# Patient Record
Sex: Female | Born: 1955 | Race: White | Hispanic: No | Marital: Married | Smoking: Never smoker
Health system: Southern US, Community
[De-identification: ages and names within clinical notes are randomized; demographics above are authoritative.]

## PROBLEM LIST (undated history)

## (undated) DIAGNOSIS — I341 Nonrheumatic mitral (valve) prolapse: Secondary | ICD-10-CM

## (undated) DIAGNOSIS — E039 Hypothyroidism, unspecified: Secondary | ICD-10-CM

## (undated) DIAGNOSIS — E079 Disorder of thyroid, unspecified: Secondary | ICD-10-CM

## (undated) HISTORY — PX: TONSILLECTOMY: SUR1361

## (undated) HISTORY — PX: ABDOMINAL HYSTERECTOMY: SHX81

## (undated) HISTORY — PX: CATARACT EXTRACTION: SUR2

## (undated) HISTORY — PX: BRAIN SURGERY: SHX531

## (undated) HISTORY — PX: NOSE SURGERY: SHX723

## (undated) HISTORY — DX: Nonrheumatic mitral (valve) prolapse: I34.1

## (undated) HISTORY — PX: REFRACTIVE SURGERY: SHX103

## (undated) HISTORY — DX: Hypothyroidism, unspecified: E03.9

---

## 1977-09-22 HISTORY — PX: BREAST EXCISIONAL BIOPSY: SUR124

## 2007-01-13 ENCOUNTER — Ambulatory Visit: Payer: Self-pay

## 2007-01-20 ENCOUNTER — Ambulatory Visit: Payer: Self-pay

## 2007-02-22 ENCOUNTER — Ambulatory Visit: Payer: Self-pay | Admitting: Oncology

## 2007-02-23 ENCOUNTER — Ambulatory Visit: Payer: Self-pay | Admitting: Oncology

## 2007-02-23 DIAGNOSIS — Z1379 Encounter for other screening for genetic and chromosomal anomalies: Secondary | ICD-10-CM

## 2007-02-23 HISTORY — DX: Encounter for other screening for genetic and chromosomal anomalies: Z13.79

## 2007-03-23 ENCOUNTER — Ambulatory Visit: Payer: Self-pay | Admitting: Oncology

## 2007-04-13 ENCOUNTER — Other Ambulatory Visit: Payer: Self-pay

## 2007-04-13 ENCOUNTER — Emergency Department: Payer: Self-pay | Admitting: Emergency Medicine

## 2007-04-22 ENCOUNTER — Ambulatory Visit: Payer: Self-pay | Admitting: Family Medicine

## 2007-04-24 ENCOUNTER — Emergency Department: Payer: Self-pay | Admitting: Emergency Medicine

## 2007-04-24 ENCOUNTER — Other Ambulatory Visit: Payer: Self-pay

## 2007-04-27 ENCOUNTER — Ambulatory Visit: Payer: Self-pay | Admitting: Gastroenterology

## 2007-05-20 ENCOUNTER — Ambulatory Visit: Payer: Self-pay | Admitting: Gastroenterology

## 2008-01-21 ENCOUNTER — Ambulatory Visit: Payer: Self-pay | Admitting: Unknown Physician Specialty

## 2008-01-27 ENCOUNTER — Ambulatory Visit: Payer: Self-pay | Admitting: Unknown Physician Specialty

## 2008-04-06 IMAGING — US ABDOMEN ULTRASOUND
1 series · 17 of 25 positions shown · non-contrast
Comparison: none

REASON FOR EXAM: RUQ Pain Positive Silindeni
COMMENTS:

[Series 1: abdomen ultrasound · 17 of 67 slices shown]
[im 1/67]
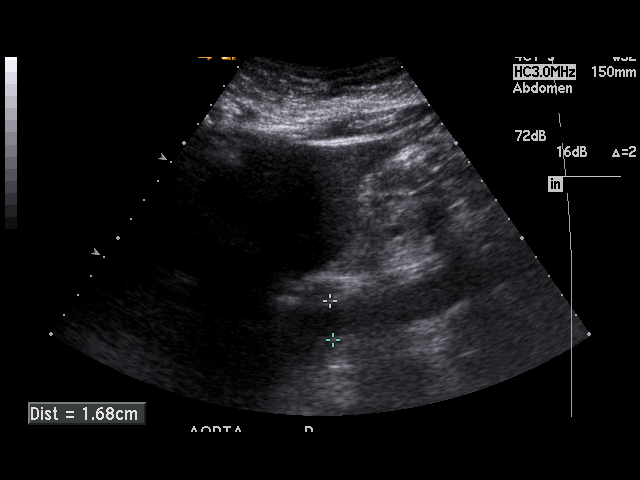
[im 6/67]
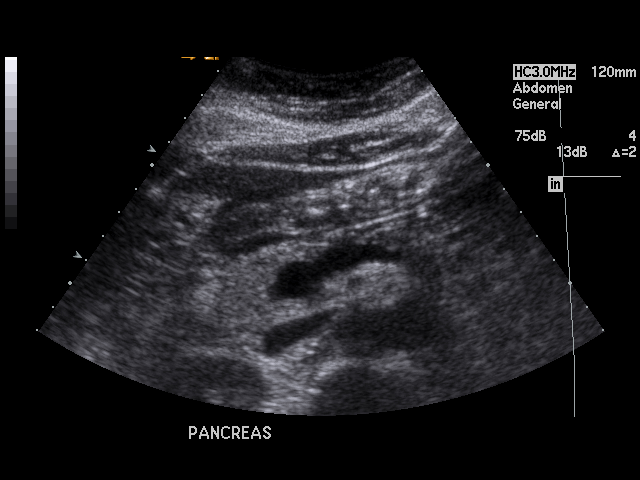
[im 9/67]
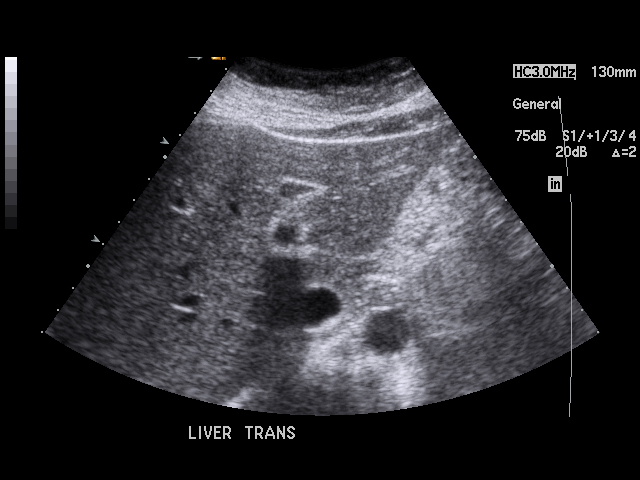
[im 14/67]
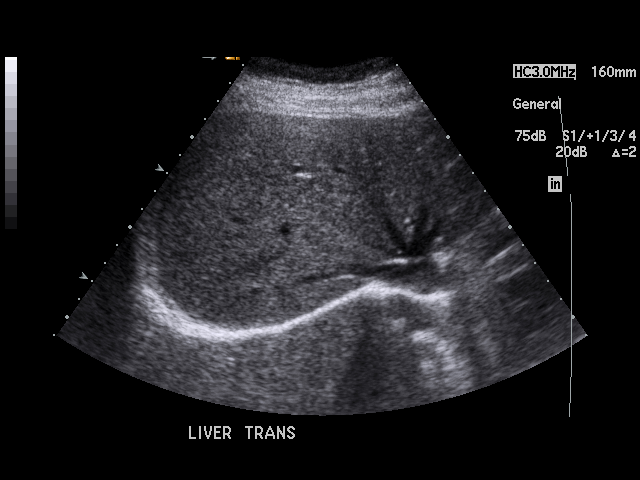
[im 17/67]
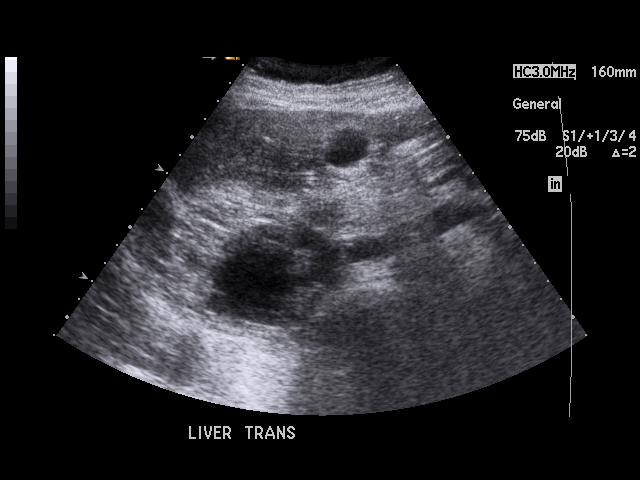
[im 23/67]
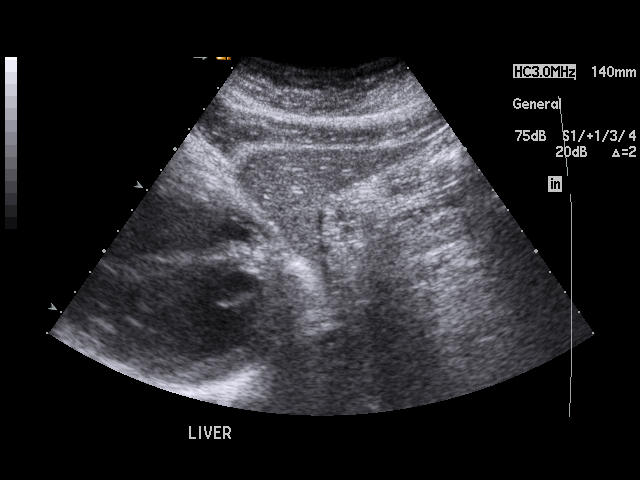
[im 25/67]
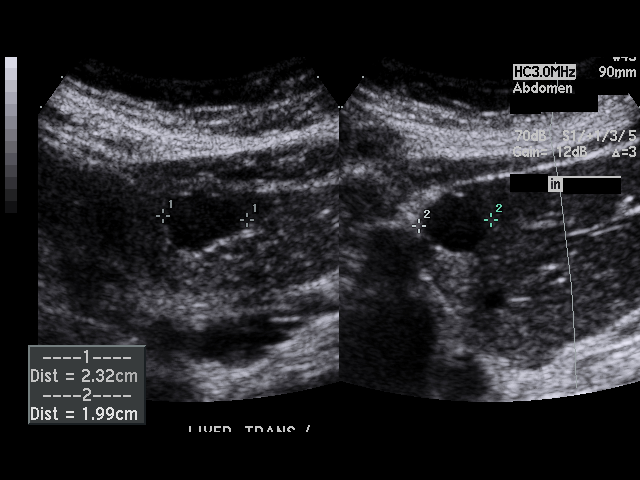
[im 31/67]
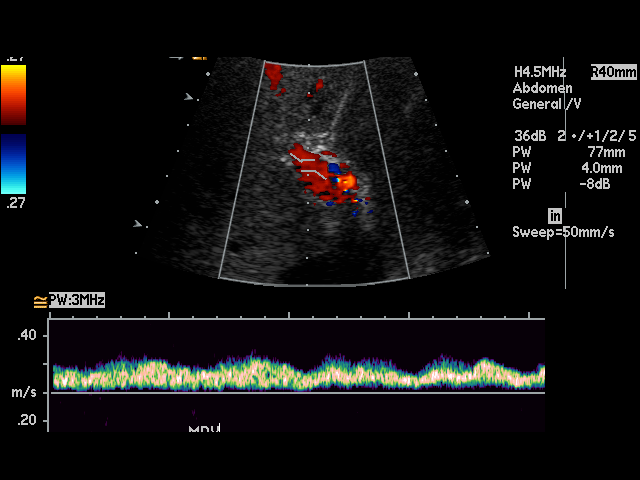
[im 34/67]
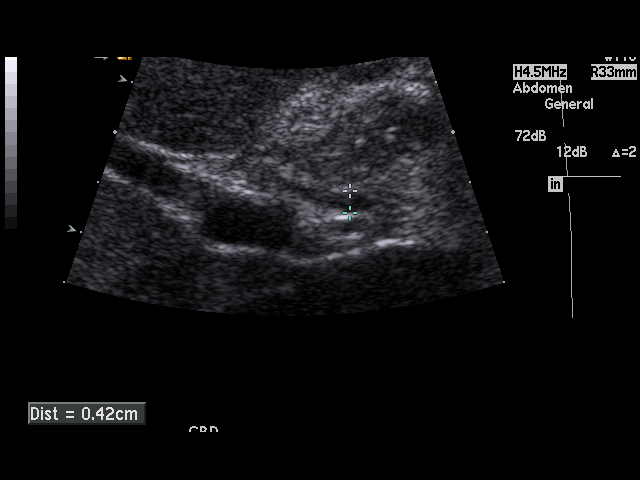
[im 36/67]
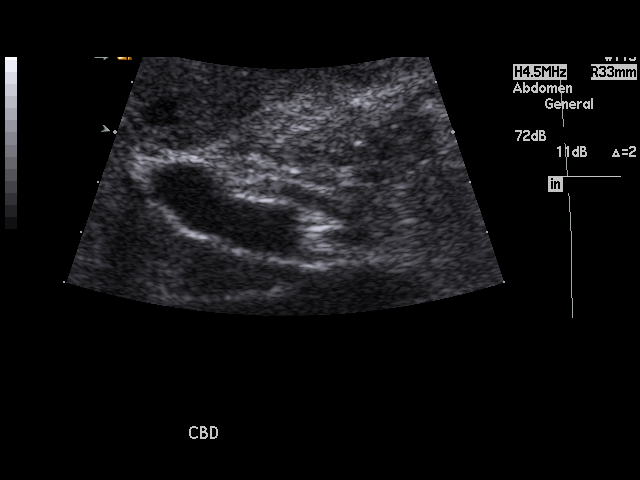
[im 42/67]
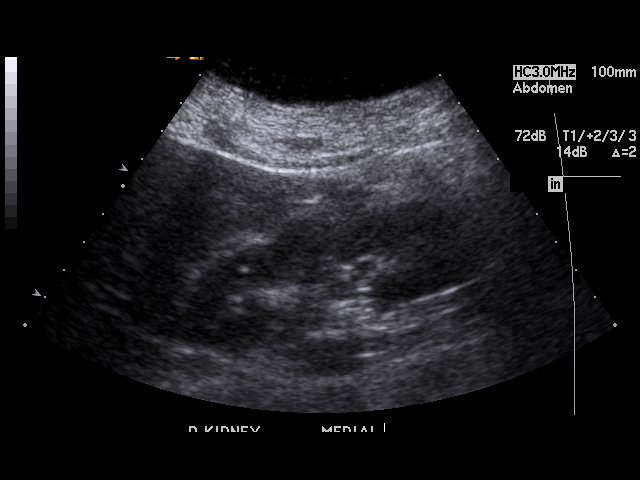
[im 45/67]
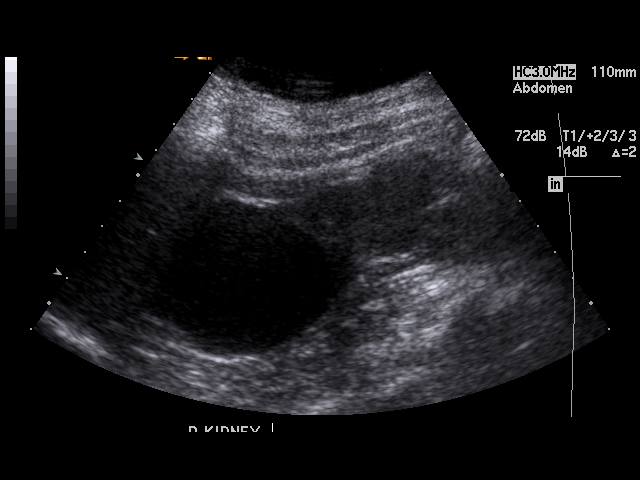
[im 50/67]
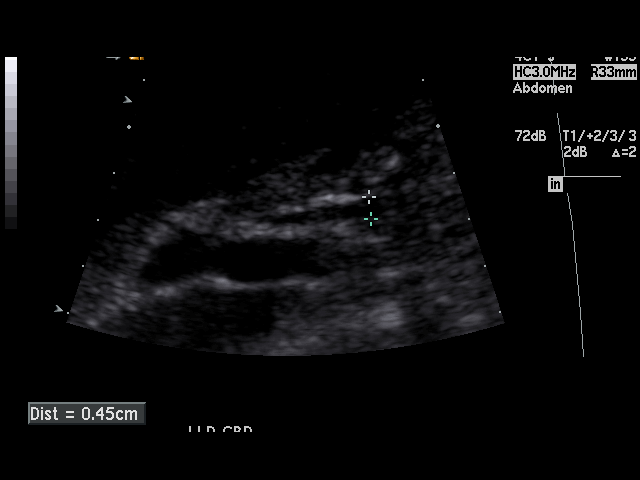
[im 53/67]
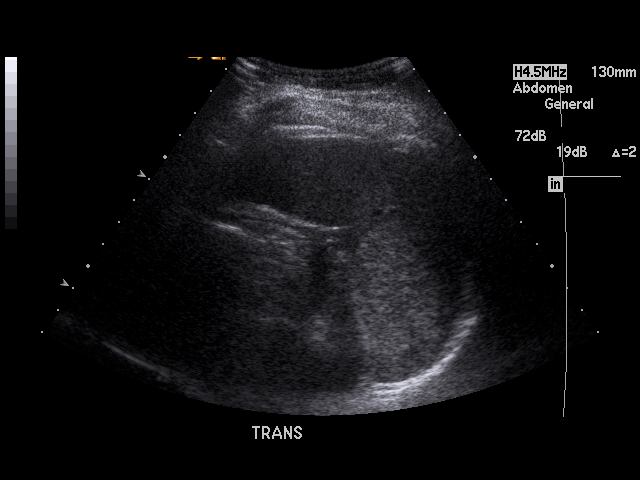
[im 58/67]
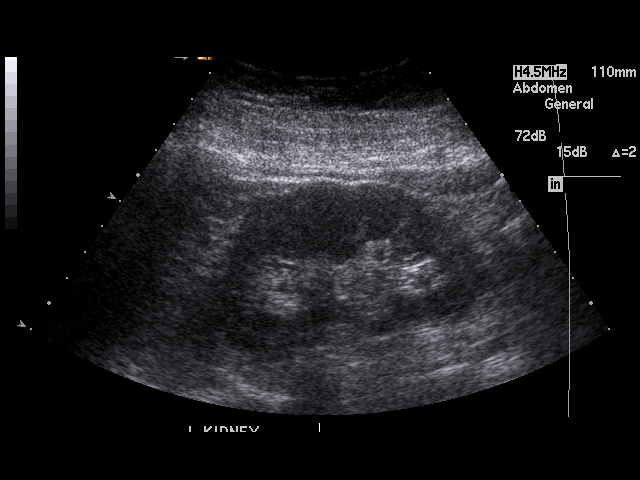
[im 61/67]
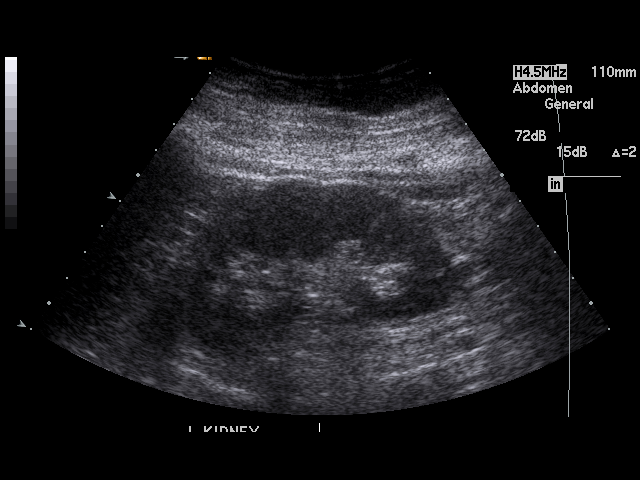
[im 67/67]
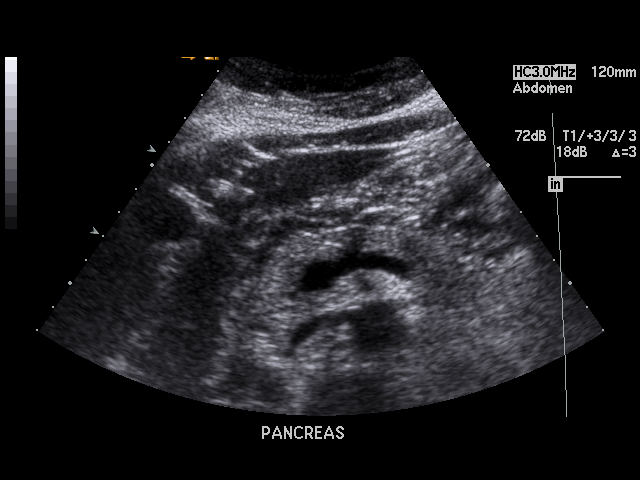

[17 of 25 positions shown; findings below may reference images not displayed]

PROCEDURE:     [facility] - [facility]DRICK ABDOMEN GENERAL SURVEY  - April 22, 2007  [DATE]

RESULT:      The liver exhibits normal echotexture. There is a cystic
structure present in the LEFT lobe which measures approximately 2.3 cm in
diameter. Portal venous flow is normal in direction toward the liver. The
pancreas, gallbladder, spleen, and abdominal aorta are normal in appearance.
The RIGHT kidney measures 12.3 cm in length. There is an approximately
cm diameter cyst associated with the upper pole of the RIGHT kidney. The
LEFT kidney measures 9.4 cm in length. There is no hydronephrosis. There is
no evidence of ascites.
IMPRESSION: 1. The gallbladder is normal in appearance with no evidence of stones.
2. There is a 5.0 cm diameter simple appearing upper pole cyst involving the
RIGHT kidney.
3. There is a cyst in the LEFT lobe of the liver measuring approximately
cm in diameter.

## 2008-11-23 ENCOUNTER — Ambulatory Visit: Payer: Self-pay | Admitting: Otolaryngology

## 2010-07-01 ENCOUNTER — Ambulatory Visit: Payer: Self-pay | Admitting: Family Medicine

## 2010-07-04 ENCOUNTER — Ambulatory Visit: Payer: Self-pay | Admitting: Family Medicine

## 2016-07-28 ENCOUNTER — Encounter (INDEPENDENT_AMBULATORY_CARE_PROVIDER_SITE_OTHER): Payer: PRIVATE HEALTH INSURANCE | Admitting: Ophthalmology

## 2016-07-28 DIAGNOSIS — H35371 Puckering of macula, right eye: Secondary | ICD-10-CM

## 2016-07-28 DIAGNOSIS — H353111 Nonexudative age-related macular degeneration, right eye, early dry stage: Secondary | ICD-10-CM | POA: Diagnosis not present

## 2016-07-28 DIAGNOSIS — H43813 Vitreous degeneration, bilateral: Secondary | ICD-10-CM

## 2016-10-28 ENCOUNTER — Encounter (INDEPENDENT_AMBULATORY_CARE_PROVIDER_SITE_OTHER): Payer: PRIVATE HEALTH INSURANCE | Admitting: Ophthalmology

## 2018-09-22 DIAGNOSIS — C50919 Malignant neoplasm of unspecified site of unspecified female breast: Secondary | ICD-10-CM

## 2018-09-22 HISTORY — DX: Malignant neoplasm of unspecified site of unspecified female breast: C50.919

## 2018-09-22 HISTORY — PX: MASTECTOMY, PARTIAL: SHX709

## 2018-12-18 ENCOUNTER — Other Ambulatory Visit: Payer: Self-pay

## 2018-12-18 ENCOUNTER — Ambulatory Visit
Admission: EM | Admit: 2018-12-18 | Discharge: 2018-12-18 | Disposition: A | Discharge status: Home / Self Care | Payer: Self-pay | Attending: Emergency Medicine | Admitting: Emergency Medicine

## 2018-12-18 ENCOUNTER — Encounter: Payer: Self-pay | Admitting: Gynecology

## 2018-12-18 DIAGNOSIS — R300 Dysuria: Secondary | ICD-10-CM

## 2018-12-18 HISTORY — DX: Disorder of thyroid, unspecified: E07.9

## 2018-12-18 LAB — WET PREP, GENITAL
Clue Cells Wet Prep HPF POC: NONE SEEN
Sperm: NONE SEEN
TRICH WET PREP: NONE SEEN
YEAST WET PREP: NONE SEEN

## 2018-12-18 LAB — URINALYSIS, COMPLETE (UACMP) WITH MICROSCOPIC
BILIRUBIN URINE: NEGATIVE
Bacteria, UA: NONE SEEN
GLUCOSE, UA: NEGATIVE mg/dL
HGB URINE DIPSTICK: NEGATIVE
KETONES UR: NEGATIVE mg/dL
LEUKOCYTE UA: NEGATIVE
NITRITE: NEGATIVE
PH: 5.5 (ref 5.0–8.0)
Protein, ur: NEGATIVE mg/dL
RBC / HPF: NONE SEEN RBC/hpf (ref 0–5)
Specific Gravity, Urine: 1.02 (ref 1.005–1.030)
WBC, UA: NONE SEEN WBC/hpf (ref 0–5)

## 2018-12-18 MED ORDER — TRIAMCINOLONE ACETONIDE 0.1 % EX CREA: 1.0000 "application " | TOPICAL_CREAM | Freq: Two times a day (BID) | CUTANEOUS | 0 refills | Status: DC

## 2018-12-18 MED ORDER — PHENAZOPYRIDINE HCL 200 MG PO TABS: 200.0000 mg | ORAL_TABLET | Freq: Three times a day (TID) | ORAL | 0 refills | Status: DC | PRN

## 2018-12-18 NOTE — Discharge Instructions (Signed)
I suspect that this is coming from the new toilet paper.  You can try the Pyridium and the triamcinolone which is a steroid that will help with the burning.  Use it sparingly and discontinue it as soon as you can

## 2018-12-18 NOTE — ED Triage Notes (Signed)
Per patient x yesterday burning with urination.

## 2018-12-18 NOTE — ED Provider Notes (Signed)
HPI  SUBJECTIVE:  Sara Sherman is a 63 y.o. female who presents with dysuria described as burning pain, urgency, frequency starting yesterday.  She states that she is urinating small amounts at a time.  No nausea, vomiting, fevers, body aches, abdominal, back, pelvic pain.  No cloudy or odorous urine, hematuria.  No vaginal itching, rash, odor, discharge, bleeding.  She has not had intercourse with her husband in 5 years.  No antipyretic in the past 4 to 6 hours.  No recent antibiotics, perfumed soaps or body washes.  She does not drink a lot of caffeine although she admits to drinking less water than usual this week.  She does note that they changed toilet paper recently-using a scented toilet paper. she tried Azo without improvement in her symptoms, symptoms are worse with urinating.  She has a past medical history of a UTI, yeast infections.  No history of pyelonephritis, nephrolithiasis, gonorrhea, chlamydia, HIV, HSV, syphilis, trichomonas, BV, diabetes, kidney disease.  PMD: Dr. Vickki Muff  Past Medical History:  Diagnosis Date  . Thyroid disease     Past Surgical History:  Procedure Laterality Date  . ABDOMINAL HYSTERECTOMY    . BRAIN SURGERY     rbreasr cyst remove  . NOSE SURGERY    . TONSILLECTOMY      Family History  Problem Relation Age of Onset  . Healthy Mother   . Breast cancer Mother   . COPD Father   . Aneurysm Father     Social History   Tobacco Use  . Smoking status: Never Smoker  . Smokeless tobacco: Never Used  Substance Use Topics  . Alcohol use: Yes    Frequency: Never  . Drug use: Never    No current facility-administered medications for this encounter.   Current Outpatient Medications:  .  cetirizine (ZYRTEC) 10 MG tablet, Take by mouth., Disp: , Rfl:  .  levothyroxine (SYNTHROID, LEVOTHROID) 112 MCG tablet, Take 1 tablet by mouth once daily, Disp: , Rfl:  .  phenazopyridine (PYRIDIUM) 200 MG tablet, Take 1 tablet (200 mg total) by mouth 3 (three)  times daily as needed for pain., Disp: 6 tablet, Rfl: 0 .  triamcinolone cream (KENALOG) 0.1 %, Apply 1 application topically 2 (two) times daily. As needed.  Discontinue as soon as possible., Disp: 30 g, Rfl: 0  No Known Allergies   ROS  As noted in HPI.   Physical Exam  BP 134/74 (BP Location: Left Arm)   Pulse 73   Temp 98.2 F (36.8 C) (Oral)   Resp 16   Ht 5\' 1"  (1.549 m)   Wt 72.6 kg   SpO2 100%   Breastfeeding Unknown   BMI 30.23 kg/m   Constitutional: Well developed, well nourished, no acute distress Eyes:  EOMI, conjunctiva normal bilaterally HENT: Normocephalic, atraumatic,mucus membranes moist Respiratory: Normal inspiratory effort Cardiovascular: Normal rate GI: nondistended. Flat, soft, active bowel sounds. No suprapubic, flank tenderness Back: no CVAT skin: No rash, skin intact Musculoskeletal: no deformities Neurologic: Alert & oriented x 3, no focal neuro deficits Psychiatric: Speech and behavior appropriate   ED Course   Medications - No data to display  Orders Placed This Encounter  Procedures  . Wet prep, genital    Standing Status:   Standing    Number of Occurrences:   1  . Urine culture    Standing Status:   Standing    Number of Occurrences:   1    Order Specific Question:   Patient  immune status    Answer:   Normal  . Urinalysis, Complete w Microscopic    Standing Status:   Standing    Number of Occurrences:   1    Results for orders placed or performed during the hospital encounter of 12/18/18 (from the past 24 hour(s))  Urinalysis, Complete w Microscopic     Status: None   Collection Time: 12/18/18 10:34 AM  Result Value Ref Range   Color, Urine YELLOW YELLOW   APPearance CLEAR CLEAR   Specific Gravity, Urine 1.020 1.005 - 1.030   pH 5.5 5.0 - 8.0   Glucose, UA NEGATIVE NEGATIVE mg/dL   Hgb urine dipstick NEGATIVE NEGATIVE   Bilirubin Urine NEGATIVE NEGATIVE   Ketones, ur NEGATIVE NEGATIVE mg/dL   Protein, ur NEGATIVE  NEGATIVE mg/dL   Nitrite NEGATIVE NEGATIVE   Leukocytes,Ua NEGATIVE NEGATIVE   Squamous Epithelial / LPF 0-5 0 - 5   WBC, UA NONE SEEN 0 - 5 WBC/hpf   RBC / HPF NONE SEEN 0 - 5 RBC/hpf   Bacteria, UA NONE SEEN NONE SEEN  Wet prep, genital     Status: Abnormal   Collection Time: 12/18/18 11:06 AM  Result Value Ref Range   Yeast Wet Prep HPF POC NONE SEEN NONE SEEN   Trich, Wet Prep NONE SEEN NONE SEEN   Clue Cells Wet Prep HPF POC NONE SEEN NONE SEEN   WBC, Wet Prep HPF POC RARE (A) NONE SEEN   Sperm NONE SEEN    No results found.  ED Clinical Impression  Dysuria   ED Assessment/Plan  Urine negative for UTI.  No bacteria in it.  Will check a wet prep.  We will send urine for culture to confirm absence of UTI.  Wet prep negative for yeast, BV, trichomonas.  Suspect contact irritant dermatitis from the lavender scented toilet paper which is new.  Will send home with Pyridium, triamcinolone cream.  discontinue the toilet paper.  Follow-up with PMD in several days.  Discussed labs, MDM, treatment plan, and plan for follow-up with patient. Discussed sn/sx that should prompt return to the ED. patient agrees with plan.   Meds ordered this encounter  Medications  . triamcinolone cream (KENALOG) 0.1 %    Sig: Apply 1 application topically 2 (two) times daily. As needed.  Discontinue as soon as possible.    Dispense:  30 g    Refill:  0  . phenazopyridine (PYRIDIUM) 200 MG tablet    Sig: Take 1 tablet (200 mg total) by mouth 3 (three) times daily as needed for pain.    Dispense:  6 tablet    Refill:  0    *This clinic note was created using [employer]. Therefore, there may be occasional mistakes despite careful proofreading.   ?    Melynda Ripple, MD 12/18/18 1731

## 2018-12-20 LAB — URINE CULTURE
Culture: 30000 — AB
Special Requests: NORMAL

## 2019-08-10 ENCOUNTER — Other Ambulatory Visit: Payer: Self-pay

## 2019-08-10 ENCOUNTER — Encounter: Payer: Self-pay | Admitting: Radiation Oncology

## 2019-08-10 ENCOUNTER — Ambulatory Visit
Admission: RE | Admit: 2019-08-10 | Discharge: 2019-08-10 | Disposition: A | Discharge status: Home / Self Care | Payer: Self-pay | Source: Ambulatory Visit | Attending: Radiation Oncology | Admitting: Radiation Oncology

## 2019-08-10 VITALS — BP 126/73 | HR 75 | Temp 97.7°F | Resp 16 | Wt 163.2 lb

## 2019-08-10 DIAGNOSIS — E079 Disorder of thyroid, unspecified: Secondary | ICD-10-CM | POA: Insufficient documentation

## 2019-08-10 DIAGNOSIS — D0511 Intraductal carcinoma in situ of right breast: Secondary | ICD-10-CM | POA: Insufficient documentation

## 2019-08-10 DIAGNOSIS — Z791 Long term (current) use of non-steroidal anti-inflammatories (NSAID): Secondary | ICD-10-CM | POA: Insufficient documentation

## 2019-08-10 DIAGNOSIS — Z170 Estrogen receptor positive status [ER+]: Secondary | ICD-10-CM | POA: Insufficient documentation

## 2019-08-10 NOTE — Consult Note (Signed)
NEW PATIENT EVALUATION  Name: Sara Sherman  MRN: [id number]  Date:   08/10/2019     DOB: [birth year]   This 63 y.o. 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 provider found  CHIEF COMPLAINT:  Chief Complaint  Patient presents with  . Breast Cancer    Initial consultation    DIAGNOSIS: The encounter diagnosis was Ductal carcinoma in situ (DCIS) of right breast.   PREVIOUS INVESTIGATIONS:  Mammogram and ultrasound reviewed Pathology report reviewed none Clinical notes reviewed  HPI: Patient is a 63 year old female who presented back in September r with abnormal mammogram performed at [facility].  At that time there was a mass at the 9 o'clock position of the right breast with circumscribed margins measuring 5 x 2 x 2 mm.  There was also a area of 7 mm amorphous calcifications in a grouped distribution seen in the right breast at the 10 o'clock position 4 cm posterior spiculated mass at mid depth.  The irregular spiculated margins were seen in the upper outer quadrant at 11:00 in mid depth.  Patient on October 29 had a wide local excision showing ductal carcinoma in situ with a small area of microinvasion ER/PR HER-2/neu status was insufficient based on tissue size.  The microinvasive component was 0.5 mm.  There was high-grade ductal carcinoma in situ with central comedonecrosis.  Margins of resection were negative for the in situ component by 0.5 mm.  Invasive carcinoma margins were negative at greater than 3 mm.  She is done well post lumpectomy.  She was seen by Dr. Tollie Pizza for second opinion and recommendation was made for adjuvant radiation therapy.  Dr. Tollie Pizza is decided based on the small microinvasive component not to perform sentinel node biopsy which I agree with.  She has had ultrasound showing a seroma cavity for possible  MammoSite balloon placement.  She is otherwise doing well specifically denies breast tenderness cough or bone pain.  PLANNED TREATMENT REGIMEN: Right breast accelerated partial breast radiation  PAST MEDICAL HISTORY:  has a past medical history of Thyroid disease.    PAST SURGICAL HISTORY:  Past Surgical History:  Procedure Laterality Date  . ABDOMINAL HYSTERECTOMY    . BRAIN SURGERY     rbreasr cyst remove  . NOSE SURGERY    . TONSILLECTOMY      FAMILY HISTORY: family history includes Aneurysm in her father; Breast cancer in her mother; COPD in her father; Healthy in her mother.  SOCIAL HISTORY:  reports that she has never smoked. She has never used smokeless tobacco. She reports current alcohol use. She reports that she does not use drugs.  ALLERGIES: Patient has no known allergies.  MEDICATIONS:  Current Outpatient Medications  Medication Sig Dispense Refill  . ketorolac (ACULAR) 0.5 % ophthalmic solution Place 1 drop into the right eye 4 (four) times daily.    Marland Kitchen levothyroxine (SYNTHROID, LEVOTHROID) 112 MCG tablet Take 1 tablet by mouth once daily    . loratadine (CLARITIN) 10 MG tablet Take 10 mg by mouth daily.     No current facility-administered medications for this encounter.     ECOG PERFORMANCE STATUS:  0 - Asymptomatic  REVIEW OF SYSTEMS: Patient denies any weight loss, fatigue, weakness, fever, chills or night sweats. Patient denies any loss of vision, blurred vision. Patient denies any ringing  of the ears or hearing  loss. No irregular heartbeat. Patient denies heart murmur or history of fainting. Patient denies any chest pain or pain radiating to her upper extremities. Patient denies any shortness of breath, difficulty breathing at night, cough or hemoptysis. Patient denies any swelling in the lower legs. Patient denies any nausea vomiting, vomiting of blood, or coffee ground material in the vomitus. Patient denies any stomach pain. Patient states has had normal  bowel movements no significant constipation or diarrhea. Patient denies any dysuria, hematuria or significant nocturia. Patient denies any problems walking, swelling in the joints or loss of balance. Patient denies any skin changes, loss of hair or loss of weight. Patient denies any excessive worrying or anxiety or significant depression. Patient denies any problems with insomnia. Patient denies excessive thirst, polyuria, polydipsia. Patient denies any swollen glands, patient denies easy bruising or easy bleeding. Patient denies any recent infections, allergies or URI. Patient "s visual fields have not changed significantly in recent time.   PHYSICAL EXAM: BP 126/73 (BP Location: Left Arm, Patient Position: Sitting)   Pulse 75   Temp 97.7 F (36.5 C) (Tympanic)   Resp 16   Wt 163 lb 3.2 oz (74 kg)   BMI 30.84 kg/m  Right breast as well healed lumpectomy incision.  No dominant mass or nodularity is noted in either breast in 2 positions examined.  No axillary or supraclavicular adenopathy is appreciated.  Well-developed well-nourished patient in NAD. HEENT reveals PERLA, EOMI, discs not visualized.  Oral cavity is clear. No oral mucosal lesions are identified. Neck is clear without evidence of cervical or supraclavicular adenopathy. Lungs are clear to A&P. Cardiac examination is essentially unremarkable with regular rate and rhythm without murmur rub or thrill. Abdomen is benign with no organomegaly or masses noted. Motor sensory and DTR levels are equal and symmetric in the upper and lower extremities. Cranial nerves II through XII are grossly intact. Proprioception is intact. No peripheral adenopathy or edema is identified. No motor or sensory levels are noted. Crude visual fields are within normal range.  LABORATORY DATA: Pathology report reviewed    RADIOLOGY RESULTS: Mammogram and ultrasound reviewed   IMPRESSION: Stage I invasive mammary carcinoma with based on microinvasion of predominantly  high-grade ductal carcinoma in situ status post wide local excision of the right breast and 63 year old female ER/PR status not able to be determined by scant amount of pathology.  PLAN: At this time I believe patient will be a good candidate for accelerated partial breast irradiation.  Risks and benefits of treatment including possible permanent seroma cavity possible small area of skin reaction fatigue thickness of the lumpectomy site all were discussed in detail with the patient.  We will arrange with Dr. Barbette Hair office for balloon catheter placement followed by CT simulation and radiation therapy 3400 cGy in 10 fractions at 340 cGy twice daily.  Patient comprehends my treatment plan well.  Patient also will be seen by medical oncology in the future for determination of possible antiestrogen therapy.  I would like to take this opportunity to thank you for allowing me to participate in the care of your patient.Noreene Filbert, MD

## 2019-08-24 ENCOUNTER — Other Ambulatory Visit: Payer: Self-pay

## 2019-08-24 ENCOUNTER — Ambulatory Visit
Admission: RE | Admit: 2019-08-24 | Discharge: 2019-08-24 | Disposition: A | Discharge status: Home / Self Care | Payer: Self-pay | Source: Ambulatory Visit | Attending: Radiation Oncology | Admitting: Radiation Oncology

## 2019-08-24 DIAGNOSIS — D0511 Intraductal carcinoma in situ of right breast: Secondary | ICD-10-CM | POA: Insufficient documentation

## 2019-08-25 ENCOUNTER — Ambulatory Visit
Admission: RE | Admit: 2019-08-25 | Discharge: 2019-08-25 | Disposition: A | Discharge status: Home / Self Care | Payer: Self-pay | Source: Ambulatory Visit | Attending: Radiation Oncology | Admitting: Radiation Oncology

## 2019-08-25 ENCOUNTER — Other Ambulatory Visit: Payer: Self-pay

## 2019-08-26 ENCOUNTER — Ambulatory Visit
Admission: RE | Admit: 2019-08-26 | Discharge: 2019-08-26 | Disposition: A | Discharge status: Home / Self Care | Payer: Self-pay | Source: Ambulatory Visit | Attending: Radiation Oncology | Admitting: Radiation Oncology

## 2019-08-26 ENCOUNTER — Other Ambulatory Visit: Payer: Self-pay

## 2019-08-29 ENCOUNTER — Ambulatory Visit
Admission: RE | Admit: 2019-08-29 | Discharge: 2019-08-29 | Disposition: A | Discharge status: Home / Self Care | Payer: Self-pay | Source: Ambulatory Visit | Attending: Radiation Oncology | Admitting: Radiation Oncology

## 2019-08-29 ENCOUNTER — Other Ambulatory Visit: Payer: Self-pay

## 2019-08-30 ENCOUNTER — Ambulatory Visit
Admission: RE | Admit: 2019-08-30 | Discharge: 2019-08-30 | Disposition: A | Discharge status: Home / Self Care | Payer: Self-pay | Source: Ambulatory Visit | Attending: Radiation Oncology | Admitting: Radiation Oncology

## 2019-08-30 ENCOUNTER — Other Ambulatory Visit: Payer: Self-pay

## 2019-08-31 ENCOUNTER — Ambulatory Visit
Admission: RE | Admit: 2019-08-31 | Discharge: 2019-08-31 | Disposition: A | Discharge status: Home / Self Care | Payer: Self-pay | Source: Ambulatory Visit | Attending: Radiation Oncology | Admitting: Radiation Oncology

## 2019-08-31 ENCOUNTER — Other Ambulatory Visit: Payer: Self-pay

## 2019-09-01 ENCOUNTER — Ambulatory Visit: Payer: Self-pay

## 2019-09-02 ENCOUNTER — Ambulatory Visit: Payer: Self-pay

## 2019-09-05 ENCOUNTER — Ambulatory Visit: Payer: Self-pay

## 2019-09-06 ENCOUNTER — Ambulatory Visit: Payer: Self-pay

## 2019-09-07 ENCOUNTER — Other Ambulatory Visit: Payer: Self-pay | Admitting: General Surgery

## 2019-09-07 ENCOUNTER — Ambulatory Visit: Payer: Self-pay

## 2019-09-07 DIAGNOSIS — Z79811 Long term (current) use of aromatase inhibitors: Secondary | ICD-10-CM

## 2019-09-07 NOTE — Progress Notes (Signed)
[id number]

## 2019-09-08 ENCOUNTER — Ambulatory Visit: Payer: Self-pay

## 2019-09-09 ENCOUNTER — Ambulatory Visit: Payer: Self-pay

## 2019-09-12 ENCOUNTER — Ambulatory Visit: Payer: Self-pay

## 2019-09-13 ENCOUNTER — Ambulatory Visit: Payer: Self-pay

## 2019-09-14 ENCOUNTER — Ambulatory Visit: Payer: Self-pay

## 2019-09-15 ENCOUNTER — Ambulatory Visit: Payer: Self-pay

## 2019-09-19 ENCOUNTER — Ambulatory Visit: Payer: Self-pay

## 2019-09-20 ENCOUNTER — Ambulatory Visit: Payer: Self-pay

## 2019-09-21 ENCOUNTER — Ambulatory Visit: Payer: Self-pay

## 2019-09-22 ENCOUNTER — Ambulatory Visit: Payer: Self-pay

## 2019-09-26 ENCOUNTER — Ambulatory Visit: Payer: Self-pay

## 2019-09-27 ENCOUNTER — Ambulatory Visit: Payer: Self-pay

## 2019-09-28 ENCOUNTER — Ambulatory Visit: Payer: Self-pay

## 2019-09-29 ENCOUNTER — Ambulatory Visit: Payer: Self-pay

## 2019-09-30 ENCOUNTER — Ambulatory Visit: Payer: Self-pay

## 2019-10-03 ENCOUNTER — Ambulatory Visit: Payer: Self-pay

## 2019-10-04 ENCOUNTER — Ambulatory Visit: Payer: Self-pay

## 2019-10-05 ENCOUNTER — Ambulatory Visit: Payer: Self-pay

## 2019-10-06 ENCOUNTER — Ambulatory Visit: Payer: Self-pay

## 2019-10-07 ENCOUNTER — Ambulatory Visit: Payer: Self-pay

## 2019-10-10 ENCOUNTER — Other Ambulatory Visit: Payer: Self-pay

## 2019-10-12 ENCOUNTER — Ambulatory Visit: Payer: Self-pay | Admitting: Radiation Oncology

## 2020-01-14 ENCOUNTER — Other Ambulatory Visit: Payer: Self-pay

## 2020-01-14 ENCOUNTER — Ambulatory Visit: Payer: Self-pay

## 2020-01-14 ENCOUNTER — Ambulatory Visit
Admission: EM | Admit: 2020-01-14 | Discharge: 2020-01-14 | Disposition: A | Discharge status: Home / Self Care | Payer: Self-pay | Attending: Family Medicine | Admitting: Family Medicine

## 2020-01-14 DIAGNOSIS — S93491A Sprain of other ligament of right ankle, initial encounter: Secondary | ICD-10-CM

## 2020-01-14 NOTE — Discharge Instructions (Signed)
-  Can weightbear as tolerated in the CAM Walker boot -Ice and elevate the ankle as much as possible over next 24 hours. -Take Ibuprofen 600-800mg  every 6-8 hours for pain and swelling. -Follow-up with Ortho for repeat evaluation of ankle sprain.

## 2020-01-14 NOTE — ED Provider Notes (Signed)
[facility]    CSN: [id number] Arrival date & time: 01/14/20  1248    History   Chief Complaint Chief Complaint  Patient presents with  . Ankle Injury    right, foot/ankle, 01/14/20    HPI Sara Sherman is a 64 y.o. female who presents today status post a fall at noon on 01/14/2020.  The patient was taking out the trash into her garage when she was walking down the steps when she slipped and fell.  This caused her to suffer a inversion injury to the right ankle, she reports immediate pain and swelling following injury.  She elevated the ankle, applied ice and also began to take ibuprofen.  She reports a 5 out of 10 pain score in the right ankle, pain is located along the lateral aspect of the ankle.  She did report initial pain along the dorsum of the right foot.  She denies any surgical history to the right ankle.  Denies any medial sided ankle pain.  She denies any numbness or tingling at today's visit.  She presents today in a wheelchair.  HPI  Past Medical History:  Diagnosis Date  . Thyroid disease     There are no problems to display for this patient.   Past Surgical History:  Procedure Laterality Date  . ABDOMINAL HYSTERECTOMY    . BRAIN SURGERY     rbreasr cyst remove  . NOSE SURGERY    . REFRACTIVE SURGERY    . TONSILLECTOMY      OB History    Gravida  1   Para      Term      Preterm      AB      Living        SAB      TAB      Ectopic      Multiple      Live Births               Home Medications    Prior to Admission medications   Medication Sig Start Date End Date Taking? Authorizing Provider  cetirizine (ZYRTEC) 10 MG tablet Take 10 mg by mouth daily.   Yes [provider]  EUTHYROX 100 MCG tablet Take 100 mcg by mouth every morning. 11/23/19   [provider]  ketorolac (ACULAR) 0.5 % ophthalmic solution Place 1 drop into the right eye 4 (four) times daily. 07/14/19   [provider]    loratadine (CLARITIN) 10 MG tablet Take 10 mg by mouth daily.    [provider]    Family History Family History  Problem Relation Age of Onset  . Healthy Mother   . Breast cancer Mother   . COPD Father   . Aneurysm Father     Social History Social History   Tobacco Use  . Smoking status: Never Smoker  . Smokeless tobacco: Never Used  Substance Use Topics  . Alcohol use: Yes  . Drug use: Never     Allergies   Patient has no known allergies.  Review of Systems Review of Systems  Musculoskeletal: Positive for arthralgias, gait problem and joint swelling.  All other systems reviewed and are negative.  Physical Exam Triage Vital Signs ED Triage Vitals  Enc Vitals Group     BP 01/14/20 1302 123/67     Pulse Rate 01/14/20 1302 69     Resp 01/14/20 1302 18     Temp 01/14/20 1302 98 F (36.7  C)     Temp Source 01/14/20 1302 Oral     SpO2 01/14/20 1302 100 %     Weight 01/14/20 1258 164 lb (74.4 kg)     Height 01/14/20 1258 5\' 1"  (1.549 m)     Head Circumference --      Peak Flow --      Pain Score 01/14/20 1258 5     Pain Loc --      Pain Edu? --      Excl. in [place]? --    No data found.  Updated Vital Signs BP 123/67 (BP Location: Left Arm)   Pulse 69   Temp 98 F (36.7 C) (Oral)   Resp 18   Ht 5\' 1"  (1.549 m)   Wt 164 lb (74.4 kg)   SpO2 100%   BMI 30.99 kg/m   Visual Acuity Right Eye Distance:   Left Eye Distance:   Bilateral Distance:    Right Eye Near:   Left Eye Near:    Bilateral Near:     Physical Exam The patient presents today in a wheelchair.  Skin examination of the right ankle demonstrates moderate swelling to the lateral aspect of the ankle.  No significant swelling noted to the medial aspect.  Minimal ecchymosis along lateral aspect as well.  The patient is tender to palpation over the ATFL, nontender to palpation over the distal fibula or along the medial aspect of the ankle.  The patient is nontender to palpation along the  dorsal or plantar aspect of the foot.  Nontender to palpation over her toes.  She is able to flex and extend all toes without significant discomfort, cap refill is intact to each individual digit.  The patient is able to dorsiflex and plantarflex the right ankle without significant pain, moderate to severe pain with inversion of the right ankle, moderate pain with eversion of the ankle.  The patient has a negative anterior drawer test.  Negative subtalar tilt test.  The patient is intact light touch of the right lower extremity.  UC Treatments / Results  Labs (all labs ordered are listed, but only abnormal results are displayed) Labs Reviewed - No data to display  EKG  Radiology No results found.  Procedures Procedures (including critical care time)  Medications Ordered in UC Medications - No data to display  Initial Impression / Assessment and Plan / UC Course  I have reviewed the triage vital signs and the nursing notes.  Pertinent labs & imaging results that were available during my care of the patient were reviewed by me and considered in my medical decision making (see chart for details).     1.  Treatment options were discussed today with the patient. 2.  X-rays of the right ankle demonstrate no evidence for acute fracture, there is no medial space widening. 3.  The patient was offered and received a cam walker boot to wear for ambulation purposes.  Can weight-bear as tolerated while wearing the boot. 4.  The patient will continue to elevate and ice the ankle on a routine basis at this time.  Ibuprofen as needed for discomfort and swelling. 5.  The patient was instructed to contact orthopedics at the beginning of the week to schedule follow-up appointment for repeat evaluation of her right ankle sprain. Final Clinical Impressions(s) / UC Diagnoses   Final diagnoses:  Sprain of anterior talofibular ligament of right ankle, initial encounter     Discharge Instructions       -  Can weightbear as tolerated in the CAM Walker boot -Ice and elevate the ankle as much as possible over next 24 hours. -Take Ibuprofen 600-800mg  every 6-8 hours for pain and swelling. -Follow-up with Ortho for repeat evaluation of ankle sprain.    ED Prescriptions    None     I have reviewed the PDMP during this encounter.   Lattie Corns, PA-C 01/14/20 1355

## 2020-01-14 NOTE — ED Triage Notes (Signed)
Pt presents with c/o right ankle/foot pain after fall at home earlier today. Pt reports pain with ambulation and weight-bearing. Pt does have swelling and bruising to the ankle/foot.

## 2020-03-30 ENCOUNTER — Ambulatory Visit
Admission: EM | Admit: 2020-03-30 | Discharge: 2020-03-30 | Disposition: A | Discharge status: Home / Self Care | Payer: Self-pay | Attending: Family Medicine | Admitting: Family Medicine

## 2020-03-30 ENCOUNTER — Other Ambulatory Visit: Payer: Self-pay

## 2020-03-30 DIAGNOSIS — L03012 Cellulitis of left finger: Secondary | ICD-10-CM

## 2020-03-30 MED ORDER — AMOXICILLIN-POT CLAVULANATE 875-125 MG PO TABS: 1.0000 | ORAL_TABLET | Freq: Two times a day (BID) | ORAL | 0 refills | Status: DC

## 2020-03-30 NOTE — Discharge Instructions (Addendum)
Warm compresses to area °

## 2020-03-30 NOTE — ED Provider Notes (Signed)
[facility]    CSN: [id number] Arrival date & time: 03/30/20  1818      History   Chief Complaint Chief Complaint  Patient presents with  . Finger infection    HPI Sara Sherman is a 64 y.o. female.   64 yo female with a c/o left index finger pain for the past 2 days since working in her yard several days ago. States she was trimming bushes. Denies any falls or other traumatic injury. Denies any fevers, chills. States she stuck a needle to the area today and got some slight drainage out.      Past Medical History:  Diagnosis Date  . Thyroid disease     There are no problems to display for this patient.   Past Surgical History:  Procedure Laterality Date  . ABDOMINAL HYSTERECTOMY    . BRAIN SURGERY     rbreasr cyst remove  . NOSE SURGERY    . REFRACTIVE SURGERY    . TONSILLECTOMY      OB History    Gravida  1   Para      Term      Preterm      AB      Living        SAB      TAB      Ectopic      Multiple      Live Births               Home Medications    Prior to Admission medications   Medication Sig Start Date End Date Taking? Authorizing Provider  amoxicillin-clavulanate (AUGMENTIN) 875-125 MG tablet Take 1 tablet by mouth 2 (two) times daily. 03/30/20   Norval Gable, MD  cetirizine (ZYRTEC) 10 MG tablet Take 10 mg by mouth daily.    [provider]  CRANBERRY-VITAMIN C PO Take by mouth.    [provider]  EUTHYROX 100 MCG tablet Take 100 mcg by mouth every morning. 11/23/19   [provider]  ketorolac (ACULAR) 0.5 % ophthalmic solution Place 1 drop into the right eye 4 (four) times daily. 07/14/19   [provider]  loratadine (CLARITIN) 10 MG tablet Take 10 mg by mouth daily.    [provider]  naproxen (NAPROSYN) 500 MG tablet Take 500 mg by mouth 2 (two) times daily. 02/27/20   [provider]    Family History Family History  Problem Relation Age of Onset  .  Healthy Mother   . Breast cancer Mother   . COPD Father   . Aneurysm Father     Social History Social History   Tobacco Use  . Smoking status: Never Smoker  . Smokeless tobacco: Never Used  Vaping Use  . Vaping Use: Never used  Substance Use Topics  . Alcohol use: Yes  . Drug use: Never     Allergies   Patient has no known allergies.   Review of Systems Review of Systems   Physical Exam Triage Vital Signs ED Triage Vitals  Enc Vitals Group     BP 03/30/20 1837 122/67     Pulse Rate 03/30/20 1837 87     Resp 03/30/20 1837 18     Temp 03/30/20 1837 98.3 F (36.8 C)     Temp Source 03/30/20 1837 Oral     SpO2 03/30/20 1837 99 %     Weight --      Height --      Head  Circumference --      Peak Flow --      Pain Score 03/30/20 1843 6     Pain Loc --      Pain Edu? --      Excl. in [place]? --    No data found.  Updated Vital Signs BP 122/67 (BP Location: Left Arm)   Pulse 87   Temp 98.3 F (36.8 C) (Oral)   Resp 18   SpO2 99%   Breastfeeding No   Visual Acuity Right Eye Distance:   Left Eye Distance:   Bilateral Distance:    Right Eye Near:   Left Eye Near:    Bilateral Near:     Physical Exam Vitals and nursing note reviewed.  Constitutional:      General: She is not in acute distress.    Appearance: She is not toxic-appearing or diaphoretic.  Musculoskeletal:     Left hand: Swelling and tenderness present. No deformity, lacerations or bony tenderness. Decreased range of motion (due to swelling). Normal strength. Normal sensation. There is no disruption of two-point discrimination. Normal capillary refill. Normal pulse.     Comments: Mid left index finger with blanchable erythema, tenderness and warmth to palpation; neurovascularly intact  Neurological:     Mental Status: She is alert.      UC Treatments / Results  Labs (all labs ordered are listed, but only abnormal results are displayed) Labs Reviewed - No data to  display  EKG   Radiology No results found.  Procedures Procedures (including critical care time)  Medications Ordered in UC Medications - No data to display  Initial Impression / Assessment and Plan / UC Course  I have reviewed the triage vital signs and the nursing notes.  Pertinent labs & imaging results that were available during my care of the patient were reviewed by me and considered in my medical decision making (see chart for details).      Final Clinical Impressions(s) / UC Diagnoses   Final diagnoses:  Cellulitis of finger of left hand     Discharge Instructions     Warm compresses to area    ED Prescriptions    Medication Sig Dispense Auth. Provider   amoxicillin-clavulanate (AUGMENTIN) 875-125 MG tablet Take 1 tablet by mouth 2 (two) times daily. 20 tablet Norval Gable, MD     1. Labs/x-ray results and diagnosis reviewed with patient 2. rx as per orders above; reviewed possible side effects, interactions, risks and benefits  3. Recommend supportive treatment as above  4. Follow-up prn if symptoms worsen or don't improve   PDMP not reviewed this encounter.   Norval Gable, MD 03/30/20 2016

## 2020-03-30 NOTE — ED Triage Notes (Signed)
Pt is here with left pointer finger(finger #2) pain that started in the middle of the night, pt has stuck a needle in it to relieve discomfort then she applied triple antibacterial ointment after.Marland Kitchen

## 2020-05-29 ENCOUNTER — Other Ambulatory Visit: Payer: Self-pay | Admitting: General Surgery

## 2020-05-29 DIAGNOSIS — D0510 Intraductal carcinoma in situ of unspecified breast: Secondary | ICD-10-CM

## 2020-05-29 NOTE — Progress Notes (Signed)
Patient prefers Mon/ Tues AM appointment. Thanks.

## 2020-06-07 ENCOUNTER — Inpatient Hospital Stay
Admission: RE | Admit: 2020-06-07 | Discharge: 2020-06-07 | Disposition: A | Discharge status: Home / Self Care | Payer: Self-pay | Source: Ambulatory Visit | Attending: *Deleted | Admitting: *Deleted

## 2020-06-07 ENCOUNTER — Other Ambulatory Visit: Payer: Self-pay | Admitting: *Deleted

## 2020-06-07 DIAGNOSIS — Z1231 Encounter for screening mammogram for malignant neoplasm of breast: Secondary | ICD-10-CM

## 2020-06-27 ENCOUNTER — Other Ambulatory Visit: Payer: Self-pay

## 2020-07-27 ENCOUNTER — Other Ambulatory Visit: Payer: Self-pay

## 2020-07-27 ENCOUNTER — Ambulatory Visit (LOCAL_COMMUNITY_HEALTH_CENTER): Payer: Self-pay

## 2020-07-27 DIAGNOSIS — Z23 Encounter for immunization: Secondary | ICD-10-CM

## 2020-07-27 NOTE — Progress Notes (Signed)
Pt is adamant that she has not had the current seasons influenza vaccine. Pt was 100% correct with specifics and dates of Covid vaccination that was provided to RN, and states she wanted to have at least a 2 week period between Covid booster and influenza vaccine, her priority was to get Covid booster first. Records in [place] and [place] show two influenza dates given in Sept 2021 but without lot numbers and specific information. Pt remembers discussing influenza vaccine with PCP at her physical and declining flu but telling them she would be getting it after the Covid booster. Discussed with Immunization Coordinator, Gregary Cromer, RN; proceeded with influenza vaccine today. RN requested that pt call [facility] and/or PCP to have her immunization records corrected.

## 2021-02-25 ENCOUNTER — Other Ambulatory Visit: Payer: Self-pay

## 2021-02-25 ENCOUNTER — Ambulatory Visit
Admission: EM | Admit: 2021-02-25 | Discharge: 2021-02-25 | Disposition: A | Discharge status: Home / Self Care | Payer: Self-pay | Attending: Physician Assistant | Admitting: Physician Assistant

## 2021-02-25 ENCOUNTER — Encounter: Payer: Self-pay | Admitting: Emergency Medicine

## 2021-02-25 DIAGNOSIS — M533 Sacrococcygeal disorders, not elsewhere classified: Secondary | ICD-10-CM | POA: Insufficient documentation

## 2021-02-25 DIAGNOSIS — N3000 Acute cystitis without hematuria: Secondary | ICD-10-CM | POA: Insufficient documentation

## 2021-02-25 LAB — URINALYSIS, COMPLETE (UACMP) WITH MICROSCOPIC
Bilirubin Urine: NEGATIVE
Glucose, UA: 100 mg/dL — AB
Ketones, ur: NEGATIVE mg/dL
Nitrite: POSITIVE — AB
Specific Gravity, Urine: 1.02 (ref 1.005–1.030)
WBC, UA: >50 WBC/hpf (ref 0–5)
pH: 5 (ref 5.0–8.0)

## 2021-02-25 MED ORDER — NITROFURANTOIN MONOHYD MACRO 100 MG PO CAPS: 100.0000 mg | ORAL_CAPSULE | Freq: Two times a day (BID) | ORAL | 0 refills | Status: AC

## 2021-02-25 NOTE — ED Triage Notes (Signed)
Patient c/o dysuria and urinary frequency that started on Saturday.

## 2021-02-25 NOTE — ED Provider Notes (Signed)
[facility]    CSN: [id number] Arrival date & time: 02/25/21  1133      History   Chief Complaint Chief Complaint  Patient presents with  . Dysuria  . Urinary Frequency    HPI Sara Sherman is a 65 y.o. female who presents with dysuria and frequency x 2 days. Denies fever, but felt chills. Has been taking Azo. Last UTI 2 years. Denies abnormal vaginal discharge. Has had suprapubic pain and sacral pain.     Past Medical History:  Diagnosis Date  . Thyroid disease     There are no problems to display for this patient.   Past Surgical History:  Procedure Laterality Date  . ABDOMINAL HYSTERECTOMY    . BRAIN SURGERY     rbreasr cyst remove  . NOSE SURGERY    . REFRACTIVE SURGERY    . TONSILLECTOMY      OB History    Gravida  1   Para      Term      Preterm      AB      Living        SAB      IAB      Ectopic      Multiple      Live Births               Home Medications    Prior to Admission medications   Medication Sig Start Date End Date Taking? Authorizing Provider  nitrofurantoin, macrocrystal-monohydrate, (MACROBID) 100 MG capsule Take 1 capsule (100 mg total) by mouth 2 (two) times daily. 02/25/21  Yes Rodriguez-Southworth, Sunday Spillers, PA-C  cetirizine (ZYRTEC) 10 MG tablet Take 10 mg by mouth daily.    [provider]  CRANBERRY-VITAMIN C PO Take by mouth.    [provider]  EUTHYROX 100 MCG tablet Take 100 mcg by mouth every morning. 11/23/19   [provider]  ketorolac (ACULAR) 0.5 % ophthalmic solution Place 1 drop into the right eye 4 (four) times daily. 07/14/19   [provider]  loratadine (CLARITIN) 10 MG tablet Take 10 mg by mouth daily.    [provider]  naproxen (NAPROSYN) 500 MG tablet Take 500 mg by mouth 2 (two) times daily. 02/27/20   [provider]    Family History Family History  Problem Relation Age of Onset  . Healthy Mother   . Breast cancer Mother    . COPD Father   . Aneurysm Father     Social History Social History   Tobacco Use  . Smoking status: Never Smoker  . Smokeless tobacco: Never Used  Vaping Use  . Vaping Use: Never used  Substance Use Topics  . Alcohol use: Yes  . Drug use: Never     Allergies   Patient has no known allergies.   Review of Systems Review of Systems  Genitourinary: Positive for dysuria, frequency and urgency. Negative for flank pain and vaginal discharge.  Musculoskeletal: Positive for back pain.  Skin: Negative for rash.     Physical Exam Triage Vital Signs ED Triage Vitals  Enc Vitals Group     BP 02/25/21 1242 123/69     Pulse Rate 02/25/21 1242 64     Resp 02/25/21 1242 18     Temp 02/25/21 1242 98.6 F (37 C)     Temp Source 02/25/21 1242 Oral     SpO2 02/25/21 1242 100 %     Weight 02/25/21 1238 164  lb 0.4 oz (74.4 kg)     Height 02/25/21 1238 5\' 1"  (1.549 m)     Head Circumference --      Peak Flow --      Pain Score 02/25/21 1240 4     Pain Loc --      Pain Edu? --      Excl. in [place]? --    No data found.  Updated Vital Signs BP 123/69 (BP Location: Left Arm)   Pulse 64   Temp 98.6 F (37 C) (Oral)   Resp 18   Ht 5\' 1"  (1.549 m)   Wt 164 lb 0.4 oz (74.4 kg)   SpO2 100%   BMI 30.99 kg/m   Visual Acuity Right Eye Distance:   Left Eye Distance:   Bilateral Distance:    Right Eye Near:   Left Eye Near:    Bilateral Near:     Physical Exam Vitals and nursing note reviewed.  Constitutional:      General: She is not in acute distress.    Appearance: She is not toxic-appearing.  HENT:     Head: Normocephalic.     Right Ear: External ear normal.     Left Ear: External ear normal.  Eyes:     General: No scleral icterus.    Conjunctiva/sclera: Conjunctivae normal.  Pulmonary:     Effort: Pulmonary effort is normal.  Abdominal:     General: Bowel sounds are normal.     Palpations: Abdomen is soft. There is no mass.     Tenderness: There is no  guarding or rebound.     Comments: - CVA tenderness   Musculoskeletal:        General: Normal range of motion.     Cervical back: Neck supple.     Comments: BACK- with  muscular tenderness on sacral region Skin:    General: Skin is warm and dry.     Findings: No rash.  Neurological:     Mental Status: She is alert and oriented to person, place, and time.     Gait: Gait normal.  Psychiatric:        Mood and Affect: Mood normal.        Behavior: Behavior normal.        Thought Content: Thought content normal.        Judgment: Judgment normal.     UC Treatments / Results  Labs (all labs ordered are listed, but only abnormal results are displayed) Labs Reviewed  URINALYSIS, COMPLETE (UACMP) WITH MICROSCOPIC - Abnormal; Notable for the following components:      Result Value   APPearance HAZY (*)    Glucose, UA 100 (*)    Hgb urine dipstick MODERATE (*)    Protein, ur TRACE (*)    Nitrite POSITIVE (*)    Leukocytes,Ua SMALL (*)    Bacteria, UA MANY (*)    All other components within normal limits  URINE CULTURE    EKG   Radiology No results found.  Procedures Procedures (including critical care time)  Medications Ordered in UC Medications - No data to display  Initial Impression / Assessment and Plan / UC Course  I have reviewed the triage vital signs and the nursing notes. Pertinent labs results that were available during my care of the patient were reviewed by me and considered in my medical decision making (see chart for details). Urine culture was sent out. Has UTI and I placed her on Macrobid as noted.  May take NSAID prn pain for lower back pain  Final Clinical Impressions(s) / UC Diagnoses   Final diagnoses:  Acute cystitis without hematuria   Discharge Instructions   None    ED Prescriptions    Medication Sig Dispense Auth. Provider   nitrofurantoin, macrocrystal-monohydrate, (MACROBID) 100 MG capsule Take 1 capsule (100 mg total) by mouth 2 (two)  times daily. 10 capsule Rodriguez-Southworth, Sunday Spillers, PA-C     PDMP not reviewed this encounter.   Shelby Mattocks, PA-C 02/25/21 1259

## 2021-02-26 LAB — URINE CULTURE: Special Requests: NORMAL

## 2021-07-08 ENCOUNTER — Ambulatory Visit: Payer: Self-pay

## 2021-07-08 ENCOUNTER — Other Ambulatory Visit: Payer: Self-pay

## 2022-08-29 ENCOUNTER — Other Ambulatory Visit: Payer: Self-pay | Admitting: General Surgery

## 2022-08-29 DIAGNOSIS — Z170 Estrogen receptor positive status [ER+]: Secondary | ICD-10-CM

## 2022-09-02 ENCOUNTER — Encounter: Payer: Self-pay | Admitting: Internal Medicine

## 2022-10-06 ENCOUNTER — Ambulatory Visit: Payer: Self-pay | Admitting: Internal Medicine

## 2022-10-06 ENCOUNTER — Other Ambulatory Visit: Payer: Self-pay

## 2022-10-07 ENCOUNTER — Encounter: Payer: Self-pay | Admitting: Internal Medicine

## 2022-10-07 ENCOUNTER — Inpatient Hospital Stay: Payer: Medicare Other | Attending: Internal Medicine | Admitting: Internal Medicine

## 2022-10-07 ENCOUNTER — Inpatient Hospital Stay: Payer: Medicare Other

## 2022-10-07 VITALS — BP 124/80 | HR 76 | Temp 97.3°F | Resp 16 | Wt 171.8 lb

## 2022-10-07 DIAGNOSIS — C50811 Malignant neoplasm of overlapping sites of right female breast: Secondary | ICD-10-CM | POA: Diagnosis not present

## 2022-10-07 DIAGNOSIS — Z170 Estrogen receptor positive status [ER+]: Secondary | ICD-10-CM | POA: Diagnosis not present

## 2022-10-07 DIAGNOSIS — Z803 Family history of malignant neoplasm of breast: Secondary | ICD-10-CM | POA: Diagnosis not present

## 2022-10-07 DIAGNOSIS — Z79811 Long term (current) use of aromatase inhibitors: Secondary | ICD-10-CM | POA: Insufficient documentation

## 2022-10-07 MED ORDER — LETROZOLE 2.5 MG PO TABS: 2.5000 mg | ORAL_TABLET | Freq: Every day | ORAL | 4 refills | Status: DC

## 2022-10-07 NOTE — Progress Notes (Signed)
Received breast radiation with Dr. Baruch Gouty in 2020.  Was prescribed Anastrozole by Dr. Fleet Contras but had to discontinue after 2 months due to side effects.  Recent mammogram and Dexa scan at [facility] (reports available for review in [place]).

## 2022-10-07 NOTE — Assessment & Plan Note (Addendum)
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  S/p adjuvant MammoSite radiation therapy.  Status post anastrozole endocrine therapy for 6 months.  Stopped because of intolerance.  #I had a long discussion with patient regarding pathology/natural history of DCIS.  Discussed option of antihormone therapy-like anastrozole/Aromasin to cut down the risk of development development of ipsilateral/contralateral invasive/noninvasive breast cancer. Discussed the mechanism of action of aromatase inhibitors-with blocking of estrogen to prevent breast cancer.  Also discussed the potential side effects including but not limited to arthralgias hot flashes and increased risk of osteoporosis.  # After lengthy discussion patient-finally is in agreement to proceed with letrozole.  New prescription started.  Patient to call us if any questions or concerns in the interim.  # BMD JAN 2024 [[facility]]: The T-score is -0.2 and the Z-score is 1. Recommend vit ca+vit D  once ad ay/OTC. Will repeat BMD in JAN 2026.   # Genetics: Mother with breast cancer at age 5 years. Screening BRCA 1- polymorphism [2008]; non-deleterious.  Discussed with genetic counselor.  Thank you Dr. Bary Castilla for allowing me to participate in the care of your pleasant patient. Please do not hesitate to contact me with questions or concerns in the interim.  # DISPOSITION:  # No labs # Follow up in 3 months; MD; no labs- Dr.B  # 60 minutes face-to-face with the patient discussing the above plan of care; more than 50% of time spent on prognosis/ natural history; counseling and coordination.

## 2022-10-07 NOTE — Progress Notes (Signed)
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   S/p  MammoSite accelerated breast radiation. Anastrozole x6 months [stopped sec to intolerance] [facility].  # BRCA-1 polymorphism/nondeleterious [id number]; Dr.Choksi]  CLINICAL    Radiologic Finding:    Mass or architectural distortion   SPECIMEN    Procedure:    Excision (less than total mastectomy)     Specimen Laterality:    Right   TUMOR    Histologic Type:    Micro-invasive carcinoma     Glandular (Acinar) / Tubular Differentiation:    Only microinvasion present (not graded)     Nuclear Pleomorphism:    Only microinvasion present (not graded)     Mitotic Rate:    Only microinvasion present (not graded)     Overall Grade:    Only microinvasion present (not graded)     Tumor Size:    Microinvasion only (<= 1  mm)     Tumor Focality:    Single focus of invasive carcinoma     Ductal Carcinoma In Situ (DCIS):    Present       Architectural Patterns:    Cribriform       Architectural Patterns:    Solid       Nuclear Grade:    Grade III (high)       Necrosis:    Present, central (expansive "comedo" necrosis)     Lobular Carcinoma In Situ (LCIS):    No LCIS in specimen     Tumor Extent:        Lymphovascular Invasion:    Not identified     Dermal Lymphovascular Invasion:    No skin present     Microcalcifications:    Present in DCIS     Microcalcifications:    Present in non-neoplastic tissue     Treatment Effect:    No known presurgical therapy   MARGINS    Invasive Carcinoma Margins:    Uninvolved by invasive carcinoma       Distance from Closest Margin (Millimeters):    >3 mm from all margins following re-excision of the inferior, superior, anterior,  posterior, medial, and lateral margins mm       Closest Margin(s):    Cannot be determined: Main specimen without orientation     DCIS Margins:    Uninvolved by DCIS       Distance from Closest Margin (Millimeters):    >3 mm from all margins following re-excision of the inferior, superior, anterior, posterior, medial, and lateral margins mm       Closest Margin(s):    Cannot be determined: Main specimen without orientation   LYMPH NODES     Regional Lymph Nodes:    No lymph nodes submitted or found   PATHOLOGIC STAGE CLASSIFICATION (pTNM, AJCC 8th Edition)     Primary Tumor (pT):    pT76m     Regional Lymph Nodes (pN):    pNX    Carcinoma of overlapping sites of right breast in female, estrogen receptor positive ([place]  10/07/2022 Initial Diagnosis   Carcinoma of overlapping sites of right breast in female, estrogen receptor positive ([place]   10/07/2022 Cancer Staging   Staging form: Breast, AJCC 8th Edition - Pathologic: Stage  Unknown (pT36m, pNX, cM0, GX, ER+, PR+, HER2: Not Assessed) - Signed by BCammie Sickle MD on 10/07/2022 Histologic grading system: 3 grade system      HISTORY OF PRESENTING ILLNESS: Alone.  Ambulating independently.  CNOORA [id number].  female prior above history of breast cancer has been referred [facility].  Patient was recently evaluated by Dr. BBary Castillafor right breast pain.  Mammograms workup negative.  To summarize patient's history-in October 2020 patient underwent wide excision of a high-grade DCIS with [facility] the following month to discuss options for radiation treatment and subsequently underwent MammoSite accelerated breast radiation.   She had [facility] and she did make use of this medication for about 6 months. She reports she stopped the anastrozole 2 years ago because  she had hot flashes, night sweats, and joint pains.  The patient reports [facility]   Review of Systems  Constitutional:  Negative for chills, diaphoresis, fever, malaise/fatigue and weight loss.  HENT:  Negative for nosebleeds and sore throat.   Eyes:  Negative for double vision.  Respiratory:  Negative for cough, hemoptysis, sputum production, shortness of breath and wheezing.   Cardiovascular:  Negative for chest pain, palpitations, orthopnea and leg swelling.  Gastrointestinal:  Negative for abdominal pain, blood in stool, constipation, diarrhea, heartburn, melena, nausea and vomiting.  Genitourinary:  Negative for dysuria, frequency and urgency.  Musculoskeletal:  Negative for back pain and joint pain.  Skin: Negative.  Negative for itching and rash.  Neurological:  Negative for dizziness, tingling, focal weakness, weakness and headaches.  Endo/Heme/Allergies:  Does not bruise/bleed easily.  Psychiatric/Behavioral:  Negative for depression. The patient is not nervous/anxious and does not have insomnia.      MEDICAL HISTORY:  Past Medical History:  Diagnosis Date   [place]ast cancer [place] [place]   Genetic testing 02/23/2007   Hypothyroidism    Mitral valve prolapse    Thyroid disease     SURGICAL HISTORY: Past Surgical History:  Procedure Laterality Date   ABDOMINAL HYSTERECTOMY     BRAIN SURGERY     rbreasr cyst remove   BREAST EXCISIONAL BIOPSY Right 1979   CATARACT EXTRACTION     MASTECTOMY, PARTIAL Right 2020   NOSE SURGERY     REFRACTIVE SURGERY     TONSILLECTOMY      SOCIAL HISTORY: Social History   Socioeconomic History   Marital status: Married    Spouse name: Not on file   Number of children: Not on file   Years of education: Not on file   Highest education level: Not on file  Occupational History   Not on file  Tobacco Use   Smoking status: Never   Smokeless tobacco: Never  Vaping Use   Vaping Use: Never  used  Substance and Sexual Activity   Alcohol use: Yes    Comment: occasional   Drug use: Never   Sexual activity: Yes    Birth control/protection: None    Comment: Married  Other Topics Concern   Not on file  Social History Narrative   Not on file   Social Determinants of Health   Financial Resource Strain: Not on file  Food Insecurity: Not on file  Transportation Needs: No Transportation Needs (10/07/2022)   PRAPARE - Transportation  Lack of Transportation (Medical): No    Lack of Transportation (Non-Medical): No  Physical Activity: Not on file  Stress: Not on file  Social Connections: Not on file  Intimate Partner Violence: Not on file    FAMILY HISTORY: Family History  Problem Relation Age of Onset   Healthy Mother    Breast cancer Mother    COPD Father    Aneurysm Father    Breast cancer Maternal Aunt     ALLERGIES:  has No Known Allergies.  MEDICATIONS:  Current Outpatient Medications  Medication Sig Dispense Refill   cetirizine (ZYRTEC) 10 MG tablet Take 10 mg by mouth daily.     CRANBERRY-VITAMIN C PO Take by mouth.     letrozole (FEMARA) 2.5 MG tablet Take 1 tablet (2.5 mg total) by mouth daily. 30 tablet 4   levothyroxine (SYNTHROID) 112 MCG tablet Take 112 mcg by mouth daily before breakfast.     loratadine (CLARITIN) 10 MG tablet Take 10 mg by mouth daily.     Multiple Vitamin (MULTIVITAMIN) capsule Take 1 capsule by mouth daily.     ketorolac (ACULAR) 0.5 % ophthalmic solution Place 1 drop into the right eye 4 (four) times daily. (Patient not taking: Reported on 10/07/2022)     naproxen (NAPROSYN) 500 MG tablet Take 500 mg by mouth 2 (two) times daily. (Patient not taking: Reported on 10/07/2022)     nitrofurantoin, macrocrystal-monohydrate, (MACROBID) 100 MG capsule Take 1 capsule (100 mg total) by mouth 2 (two) times daily. (Patient not taking: Reported on 10/07/2022) 10 capsule 0   No current facility-administered medications for this visit.       Marland Kitchen  PHYSICAL EXAMINATION: ECOG PERFORMANCE STATUS: 0 - Asymptomatic  Vitals:   10/07/22 1100  BP: 124/80  Pulse: 76  Resp: 16  Temp: (!) 97.3 F (36.3 C)   Filed Weights   10/07/22 1100  Weight: 171 lb 12.8 oz (77.9 kg)    Physical Exam Vitals and nursing note reviewed.  HENT:     Head: Normocephalic and atraumatic.     Mouth/Throat:     Pharynx: Oropharynx is clear.  Eyes:     Extraocular Movements: Extraocular movements intact.     Pupils: Pupils are equal, round, and reactive to light.  Cardiovascular:     Rate and Rhythm: Normal rate and regular rhythm.  Pulmonary:     Comments: Decreased breath sounds bilaterally.  Abdominal:     Palpations: Abdomen is soft.  Musculoskeletal:        General: Normal range of motion.     Cervical back: Normal range of motion.  Skin:    General: Skin is warm.  Neurological:     General: No focal deficit present.     Mental Status: She is alert and oriented to person, place, and time.  Psychiatric:        Behavior: Behavior normal.        Judgment: Judgment normal.      LABORATORY DATA:  I have reviewed the data as listed No results found for: "WBC", "HGB", "HCT", "MCV", "PLT" No results for input(s): "NA", "K", "CL", "CO2", "GLUCOSE", "BUN", "CREATININE", "CALCIUM", "GFRNONAA", "GFRAA", "PROT", "ALBUMIN", "AST", "ALT", "ALKPHOS", "BILITOT", "BILIDIR", "IBILI" in the last 8760 hours.  RADIOGRAPHIC STUDIES: I have personally reviewed the radiological images as listed and agreed with the findings in the report. No results found.  ASSESSMENT & PLAN:   Carcinoma of overlapping sites of right breast in female, estrogen receptor positive [facility]) # Oct  2020 [[facility]]- Right breast DCIS high-grade; incidental diagnosis of microinvasive breast cancer [0.5 mm].  S/p adjuvant MammoSite radiation therapy.  Status post anastrozole endocrine therapy for 6 months.  Stopped because of intolerance.  #I had a long discussion with patient  regarding pathology/natural history of DCIS.  Discussed option of antihormone therapy-like anastrozole/Aromasin to cut down the risk of development development of ipsilateral/contralateral invasive/noninvasive breast cancer. Discussed the mechanism of action of aromatase inhibitors-with blocking of estrogen to prevent breast cancer.  Also discussed the potential side effects including but not limited to arthralgias hot flashes and increased risk of osteoporosis.  # After lengthy discussion patient-finally is in agreement to proceed with letrozole.  New prescription started.  Patient to call us if any questions or concerns in the interim.  # BMD JAN 2024 [[facility]]: The T-score is -0.2 and the Z-score is 1. Recommend vit ca+vit D  once ad ay/OTC. Will repeat BMD in JAN 2026.   # Genetics: Mother with breast cancer at age 40 years. Screening BRCA 1- polymorphism [2008]; non-deleterious.  Discussed with genetic counselor.  Thank you Dr. Bary Castilla for allowing me to participate in the care of your pleasant patient. Please do not hesitate to contact me with questions or concerns in the interim.  # DISPOSITION:  # No labs # Follow up in 3 months; MD; no labs- Dr.B  # 60 minutes face-to-face with the patient discussing the above plan of care; more than 50% of time spent on prognosis/ natural history; counseling and coordination.   All questions were answered. The patient/family knows to call the clinic with any problems, questions or concerns.    Cammie Sickle, MD 10/07/2022 1:08 PM

## 2023-01-06 ENCOUNTER — Inpatient Hospital Stay: Payer: Medicare Other | Admitting: Internal Medicine

## 2023-02-25 ENCOUNTER — Other Ambulatory Visit: Payer: Self-pay | Admitting: Internal Medicine
